# Patient Record
Sex: Female | Born: 1950 | Race: White | Hispanic: No | Marital: Married | Smoking: Never smoker
Health system: Southern US, Community
[De-identification: ages and names within clinical notes are randomized; demographics above are authoritative.]

## PROBLEM LIST (undated history)

## (undated) DIAGNOSIS — N189 Chronic kidney disease, unspecified: Secondary | ICD-10-CM

## (undated) DIAGNOSIS — M797 Fibromyalgia: Secondary | ICD-10-CM

## (undated) DIAGNOSIS — G43909 Migraine, unspecified, not intractable, without status migrainosus: Secondary | ICD-10-CM

## (undated) DIAGNOSIS — M1990 Unspecified osteoarthritis, unspecified site: Secondary | ICD-10-CM

## (undated) HISTORY — DX: Chronic kidney disease, unspecified: N18.9

## (undated) HISTORY — PX: TUBAL LIGATION: SHX77

## (undated) HISTORY — DX: Unspecified osteoarthritis, unspecified site: M19.90

## (undated) HISTORY — DX: Fibromyalgia: M79.7

## (undated) HISTORY — PX: CHOLECYSTECTOMY: SHX55

## (undated) HISTORY — PX: ABDOMINAL HYSTERECTOMY: SHX81

## (undated) HISTORY — PX: APPENDECTOMY: SHX54

## (undated) HISTORY — DX: Migraine, unspecified, not intractable, without status migrainosus: G43.909

---

## 2006-10-28 ENCOUNTER — Other Ambulatory Visit: Payer: Self-pay

## 2006-10-28 ENCOUNTER — Emergency Department: Payer: Self-pay | Admitting: Unknown Physician Specialty

## 2007-01-23 ENCOUNTER — Emergency Department: Payer: Self-pay | Admitting: Unknown Physician Specialty

## 2007-02-01 ENCOUNTER — Ambulatory Visit: Payer: Self-pay | Admitting: Ophthalmology

## 2007-02-09 ENCOUNTER — Ambulatory Visit: Payer: Self-pay | Admitting: Ophthalmology

## 2007-04-06 ENCOUNTER — Ambulatory Visit: Payer: Self-pay | Admitting: Specialist

## 2007-12-10 ENCOUNTER — Ambulatory Visit: Payer: Self-pay

## 2008-02-22 ENCOUNTER — Ambulatory Visit: Payer: Self-pay | Admitting: Family Medicine

## 2011-06-24 ENCOUNTER — Ambulatory Visit: Payer: Self-pay | Admitting: Internal Medicine

## 2012-12-30 ENCOUNTER — Ambulatory Visit: Payer: Self-pay

## 2014-03-01 ENCOUNTER — Ambulatory Visit: Payer: Self-pay | Admitting: Family Medicine

## 2014-11-21 ENCOUNTER — Ambulatory Visit
Admission: RE | Admit: 2014-11-21 | Discharge: 2014-11-21 | Disposition: A | Discharge status: Home / Self Care | Source: Ambulatory Visit | Attending: Ophthalmology | Admitting: Ophthalmology

## 2014-11-21 ENCOUNTER — Other Ambulatory Visit: Payer: Self-pay | Admitting: Ophthalmology

## 2014-11-21 DIAGNOSIS — S0441XA Injury of abducent nerve, right side, initial encounter: Secondary | ICD-10-CM

## 2014-11-21 DIAGNOSIS — H5711 Ocular pain, right eye: Secondary | ICD-10-CM | POA: Diagnosis not present

## 2014-11-21 DIAGNOSIS — J3489 Other specified disorders of nose and nasal sinuses: Secondary | ICD-10-CM | POA: Insufficient documentation

## 2014-11-21 DIAGNOSIS — G319 Degenerative disease of nervous system, unspecified: Secondary | ICD-10-CM | POA: Diagnosis not present

## 2014-11-21 DIAGNOSIS — H532 Diplopia: Secondary | ICD-10-CM | POA: Insufficient documentation

## 2014-11-21 DIAGNOSIS — H4921 Sixth [abducent] nerve palsy, right eye: Secondary | ICD-10-CM

## 2014-11-21 MED ORDER — GADOBENATE DIMEGLUMINE 529 MG/ML IV SOLN
20.0000 mL | Freq: Once | INTRAVENOUS | Status: AC | PRN
  Administered 2014-11-21: 17 mL via INTRAVENOUS

## 2015-03-12 ENCOUNTER — Other Ambulatory Visit: Payer: Self-pay | Admitting: Family Medicine

## 2015-03-12 DIAGNOSIS — R590 Localized enlarged lymph nodes: Secondary | ICD-10-CM

## 2015-03-20 ENCOUNTER — Ambulatory Visit: Admission: RE | Admit: 2015-03-20 | Payer: Medicare Other | Source: Ambulatory Visit

## 2015-05-20 DIAGNOSIS — N189 Chronic kidney disease, unspecified: Secondary | ICD-10-CM

## 2015-05-20 HISTORY — DX: Chronic kidney disease, unspecified: N18.9

## 2016-03-14 ENCOUNTER — Other Ambulatory Visit: Payer: Self-pay | Admitting: Family Medicine

## 2016-03-14 DIAGNOSIS — R7989 Other specified abnormal findings of blood chemistry: Secondary | ICD-10-CM

## 2016-03-14 DIAGNOSIS — R1011 Right upper quadrant pain: Secondary | ICD-10-CM

## 2016-03-14 DIAGNOSIS — R945 Abnormal results of liver function studies: Secondary | ICD-10-CM

## 2016-03-31 ENCOUNTER — Ambulatory Visit: Payer: TRICARE For Life (TFL)

## 2017-07-22 ENCOUNTER — Other Ambulatory Visit: Payer: Self-pay | Admitting: Family Medicine

## 2017-07-22 DIAGNOSIS — Z1239 Encounter for other screening for malignant neoplasm of breast: Secondary | ICD-10-CM

## 2017-07-22 DIAGNOSIS — Z780 Asymptomatic menopausal state: Secondary | ICD-10-CM

## 2018-04-12 ENCOUNTER — Inpatient Hospital Stay: Payer: Medicare Other

## 2018-04-12 ENCOUNTER — Ambulatory Visit
Admission: RE | Admit: 2018-04-12 | Discharge: 2018-04-12 | Disposition: A | Discharge status: Home / Self Care | Payer: Medicare Other | Source: Ambulatory Visit | Attending: Oncology | Admitting: Oncology

## 2018-04-12 ENCOUNTER — Encounter (INDEPENDENT_AMBULATORY_CARE_PROVIDER_SITE_OTHER): Payer: Self-pay

## 2018-04-12 ENCOUNTER — Other Ambulatory Visit: Payer: Self-pay

## 2018-04-12 ENCOUNTER — Inpatient Hospital Stay: Payer: Medicare Other | Attending: Oncology | Admitting: Oncology

## 2018-04-12 ENCOUNTER — Encounter: Payer: Self-pay | Admitting: Oncology

## 2018-04-12 VITALS — BP 154/84 | HR 78 | Temp 96.4°F | Resp 18 | Ht 63.5 in | Wt 209.9 lb

## 2018-04-12 DIAGNOSIS — D751 Secondary polycythemia: Secondary | ICD-10-CM

## 2018-04-12 DIAGNOSIS — Z79890 Hormone replacement therapy (postmenopausal): Secondary | ICD-10-CM | POA: Diagnosis not present

## 2018-04-12 DIAGNOSIS — Z791 Long term (current) use of non-steroidal anti-inflammatories (NSAID): Secondary | ICD-10-CM | POA: Diagnosis not present

## 2018-04-12 DIAGNOSIS — Z8249 Family history of ischemic heart disease and other diseases of the circulatory system: Secondary | ICD-10-CM | POA: Insufficient documentation

## 2018-04-12 DIAGNOSIS — Z8042 Family history of malignant neoplasm of prostate: Secondary | ICD-10-CM | POA: Insufficient documentation

## 2018-04-12 DIAGNOSIS — Z79899 Other long term (current) drug therapy: Secondary | ICD-10-CM

## 2018-04-12 LAB — URINALYSIS, COMPLETE (UACMP) WITH MICROSCOPIC
BILIRUBIN URINE: NEGATIVE
Glucose, UA: NEGATIVE mg/dL
Ketones, ur: NEGATIVE mg/dL
Leukocytes, UA: NEGATIVE
Nitrite: NEGATIVE
PROTEIN: NEGATIVE mg/dL
pH: 5.5 (ref 5.0–8.0)

## 2018-04-12 LAB — CBC WITH DIFFERENTIAL/PLATELET
Abs Immature Granulocytes: 0.01 10*3/uL (ref 0.00–0.07)
Basophils Absolute: 0.1 10*3/uL (ref 0.0–0.1)
Basophils Relative: 1 %
EOS PCT: 3 %
Eosinophils Absolute: 0.2 10*3/uL (ref 0.0–0.5)
HCT: 51.9 % — ABNORMAL HIGH (ref 36.0–46.0)
Hemoglobin: 17.5 g/dL — ABNORMAL HIGH (ref 12.0–15.0)
Immature Granulocytes: 0 %
Lymphocytes Relative: 39 %
Lymphs Abs: 2.7 10*3/uL (ref 0.7–4.0)
MCH: 29.4 pg (ref 26.0–34.0)
MCHC: 33.7 g/dL (ref 30.0–36.0)
MCV: 87.1 fL (ref 80.0–100.0)
MONO ABS: 0.5 10*3/uL (ref 0.1–1.0)
MONOS PCT: 8 %
Neutro Abs: 3.4 10*3/uL (ref 1.7–7.7)
Neutrophils Relative %: 49 %
PLATELETS: 232 10*3/uL (ref 150–400)
RBC: 5.96 MIL/uL — ABNORMAL HIGH (ref 3.87–5.11)
RDW: 13.3 % (ref 11.5–15.5)
WBC: 6.9 10*3/uL (ref 4.0–10.5)
nRBC: 0 % (ref 0.0–0.2)

## 2018-04-13 LAB — ERYTHROPOIETIN: ERYTHROPOIETIN: 5 m[IU]/mL (ref 2.6–18.5)

## 2018-04-19 ENCOUNTER — Encounter: Payer: Self-pay | Admitting: Oncology

## 2018-04-19 LAB — JAK2 GENOTYPR

## 2018-04-19 NOTE — Progress Notes (Signed)
Hematology/Oncology Consult note [facility] [phone] [phone]  Patient Care Team: Clarisse Gouge, MD as PCP - General (Family Medicine)   Name of the patient: Kara Morgan  [id number]  31-Oct-1950    Reason for referral- polycythemia   Referring physician- Dr. Vickki Muff  Date of visit: 04/19/18   History of presenting illness-patient is a 67 year old female who has been referred to [place] for polycythemia.  Her recent CBC from 03/18/2018 showed white count of 6.9, H&H of 17.8/52.7 and a platelet count of 209.  Looking back at her prior CBCs her hemoglobin was around 16 between 2015 and 2017 and has gradually moved up to 17 since 2019.She has been a lifetime never smoker.  She was taking hormone replacement therapy which included estrogen and testosterone she stopped taking it about 1 month ago.  Does not have any known chronic lung disease.  Denies any blood in her urine, fatigue or unintentional weight loss.  She does tend to wake up 2-3 times a day but feels that she gets a good night sleep.   ECOG PS- 1  Pain scale- 0   Review of systems- Review of Systems  Constitutional: Positive for malaise/fatigue. Negative for chills, fever and weight loss.  HENT: Negative for congestion, ear discharge and nosebleeds.   Eyes: Negative for blurred vision.  Respiratory: Negative for cough, hemoptysis, sputum production, shortness of breath and wheezing.   Cardiovascular: Negative for chest pain, palpitations, orthopnea and claudication.  Gastrointestinal: Negative for abdominal pain, blood in stool, constipation, diarrhea, heartburn, melena, nausea and vomiting.  Genitourinary: Negative for dysuria, flank pain, frequency, hematuria and urgency.  Musculoskeletal: Negative for back pain, joint pain and myalgias.  Skin: Negative for rash.  Neurological: Negative for dizziness, tingling, focal weakness, seizures, weakness and headaches.    Endo/Heme/Allergies: Does not bruise/bleed easily.  Psychiatric/Behavioral: Negative for depression and suicidal ideas. The patient does not have insomnia.     Allergies  Allergen Reactions  . Codeine     Severe Headache to Migraine  . Demerol [Meperidine Hcl]     Severe Headache to Migraine  . Morphine And Related     Severe Headache to Migraine  . Tramadol     Severe Headache to Migraine    There are no active problems to display for this patient.    Past Medical History:  Diagnosis Date  . Arthritis   . Chronic kidney disease 2017   Kidney Stones  . Fibromyalgia   . Migraines      Past Surgical History:  Procedure Laterality Date  . ABDOMINAL HYSTERECTOMY    . APPENDECTOMY    . CHOLECYSTECTOMY    . TUBAL LIGATION      Social History   Socioeconomic History  . Marital status: Married    Spouse name: Keenan Bachelor  . Number of children: 4  . Years of education: 2  . Highest education level: Professional school degree (e.g., MD, DDS, DVM, JD)  Occupational History  . Not on file  Social Needs  . Financial resource strain: Not on file  . Food insecurity:    Worry: Not on file    Inability: Not on file  . Transportation needs:    Medical: Not on file    Non-medical: Not on file  Tobacco Use  . Smoking status: Never Smoker  . Smokeless tobacco: Never Used  Substance and Sexual Activity  . Alcohol use: Never    Frequency: Never  . Drug use: Never  .  Sexual activity: Yes  Lifestyle  . Physical activity:    Days per week: Not on file    Minutes per session: Not on file  . Stress: Not on file  Relationships  . Social connections:    Talks on phone: Not on file    Gets together: Not on file    Attends religious service: Not on file    Active member of club or organization: Not on file    Attends meetings of clubs or organizations: Not on file    Relationship status: Not on file  . Intimate partner violence:    Fear of current or ex partner: Not on file     Emotionally abused: Not on file    Physically abused: Not on file    Forced sexual activity: Not on file  Other Topics Concern  . Not on file  Social History Narrative  . Not on file     Family History  Problem Relation Age of Onset  . Hypertension Mother   . Stroke Mother   . Skin cancer Father   . Prostate cancer Father   . Multiple myeloma Sister   . Non-Hodgkin's lymphoma Sister   . Diabetes Maternal Uncle   . Heart disease Paternal Uncle   . Heart attack Maternal Grandmother   . Heart disease Paternal Uncle   . Melanoma Paternal Uncle      Current Outpatient Medications:  .  ALPRAZolam (XANAX) 0.5 MG tablet, Take 0.5 mg by mouth daily as needed., Disp: , Rfl:  .  butalbital-acetaminophen-caffeine (FIORICET WITH CODEINE) 50-325-40-30 MG capsule, Take 1 capsule by mouth daily as needed., Disp: , Rfl:  .  celecoxib (CELEBREX) 200 MG capsule, Take 200 mg by mouth daily., Disp: , Rfl:  .  DULoxetine (CYMBALTA) 60 MG capsule, Take 60 mg by mouth daily., Disp: , Rfl:  .  estrogen-methylTESTOSTERone 0.625-1.25 MG tablet, Take 1 tablet by mouth daily., Disp: , Rfl:  .  ibuprofen (ADVIL,MOTRIN) 200 MG tablet, Take 400 mg by mouth every 6 (six) hours as needed., Disp: , Rfl:  .  omeprazole (PRILOSEC) 20 MG capsule, Take 20 mg by mouth 2 (two) times daily. Patient takes once a day., Disp: , Rfl:  .  traZODone (DESYREL) 50 MG tablet, Take 50 mg by mouth at bedtime., Disp: , Rfl:    Physical exam:  Vitals:   04/12/18 1353  BP: (!) 154/84  Pulse: 78  Resp: 18  Temp: (!) 96.4 F (35.8 C)  TempSrc: Tympanic  SpO2: 96%  Weight: 209 lb 14.1 oz (95.2 kg)  Height: 5' 3.5" (1.613 m)   Physical Exam  Constitutional: She is oriented to person, place, and time. She appears well-developed and well-nourished.  HENT:  Head: Normocephalic and atraumatic.  Eyes: Pupils are equal, round, and reactive to light. EOM are normal.  Neck: Normal range of motion.  Cardiovascular: Normal  rate, regular rhythm and normal heart sounds.  Pulmonary/Chest: Effort normal and breath sounds normal.  Abdominal: Soft. Bowel sounds are normal.  No palpable splenomegaly  Neurological: She is alert and oriented to person, place, and time.  Skin: Skin is warm and dry.      No flowsheet data found. CBC Latest Ref Rng & Units 04/12/2018  WBC 4.0 - 10.5 K/uL 6.9  Hemoglobin 12.0 - 15.0 g/dL 17.5(H)  Hematocrit 36.0 - 46.0 % 51.9(H)  Platelets 150 - 400 K/uL 232    No images are attached to the encounter.  Dg Chest 2 View  Result Date: 04/13/2018 CLINICAL DATA:  Polycythemia EXAM: CHEST - 2 VIEW COMPARISON:  None. FINDINGS: Cardiac shadows within normal limits. The lungs are well aerated bilaterally. No focal infiltrate or sizable effusion is seen. Degenerative changes of the thoracic spine are noted. IMPRESSION: No active cardiopulmonary disease. Electronically Signed   By: Inez Catalina M.D.   On: 04/13/2018 08:08    Assessment and plan- Patient is a 67 y.o. female referred for polycythemia  Based on her history it is likely that her polycythemia is secondary due to testosterone use in her hormone replacement therapy. OSA is another possibility. Discussed primary versus secondary polycythemia. causea and treatment. I will start off with cbc with diff, cmp, urinalysis, epo, JAK2 mutation testing and CXR. I will see her back in 2 weeks to discuss the results of her bloodwork and further management   Thank you for this kind referral and the opportunity to participate in the care of this patient   Visit Diagnosis 1. Polycythemia     Dr. Randa Evens, MD, MPH [facility] [phone] 04/19/2018  8:31 AM

## 2018-04-26 ENCOUNTER — Encounter: Payer: Self-pay | Admitting: Oncology

## 2018-04-26 ENCOUNTER — Inpatient Hospital Stay: Payer: Medicare Other | Attending: Oncology | Admitting: Oncology

## 2018-04-26 ENCOUNTER — Inpatient Hospital Stay: Payer: Medicare Other

## 2018-04-26 VITALS — BP 135/86 | HR 69 | Resp 16

## 2018-04-26 DIAGNOSIS — Z791 Long term (current) use of non-steroidal anti-inflammatories (NSAID): Secondary | ICD-10-CM | POA: Insufficient documentation

## 2018-04-26 DIAGNOSIS — D751 Secondary polycythemia: Secondary | ICD-10-CM | POA: Diagnosis present

## 2018-04-26 DIAGNOSIS — Z79890 Hormone replacement therapy (postmenopausal): Secondary | ICD-10-CM | POA: Diagnosis not present

## 2018-04-26 DIAGNOSIS — Z79899 Other long term (current) drug therapy: Secondary | ICD-10-CM

## 2018-04-26 DIAGNOSIS — R5383 Other fatigue: Secondary | ICD-10-CM | POA: Insufficient documentation

## 2018-04-26 NOTE — Patient Instructions (Signed)

## 2018-04-29 NOTE — Progress Notes (Signed)
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  Her recent CBC from 03/18/2018 showed white count of 6.9, H&H of 17.8/52.7 and a platelet count of 209.  Looking back at her prior CBCs her hemoglobin was around 16 between 2015 and 2017 and has gradually moved up to 17 since 2019.She has been a lifetime never smoker.  She was taking hormone replacement therapy which included estrogen and testosterone she stopped taking it about 1 month ago.  Does not have any known chronic lung disease.  Denies any blood in her urine, fatigue or unintentional weight loss.  She does tend to wake up 2-3 times a day but feels that she gets a good night sleep.   Results of polycythemia work-up were as follows: CBC showed white count of 6.9, H&H of 17.5/51.9 and a platelet count of 232.  EPO levels were normal at 5.  Jak 2 mutation testing was negative.  Urinalysis did not reveal any diarrhea.  Chest x-ray revealed no acute active cardiopulmonary disease.  Interval history-patient continues to feel significant fatigue.  Still taking her Estratest as hormone replacement therapy and is yet to receive HRT which does not have testosterone.  ECOG PS- 1 Pain scale- 0 Opioid associated constipation- no  Review of systems- Review of Systems  Constitutional: Positive for malaise/fatigue. Negative for chills, fever and weight loss.  HENT: Negative for congestion, ear discharge and nosebleeds.   Eyes: Negative for blurred vision.  Respiratory: Negative for cough, hemoptysis,  sputum production, shortness of breath and wheezing.   Cardiovascular: Negative for chest pain, palpitations, orthopnea and claudication.  Gastrointestinal: Negative for abdominal pain, blood in stool, constipation, diarrhea, heartburn, melena, nausea and vomiting.  Genitourinary: Negative for dysuria, flank pain, frequency, hematuria and urgency.  Musculoskeletal: Negative for back pain, joint pain and myalgias.  Skin: Negative for rash.  Neurological: Negative for dizziness, tingling, focal weakness, seizures, weakness and headaches.  Endo/Heme/Allergies: Does not bruise/bleed easily.  Psychiatric/Behavioral: Negative for depression and suicidal ideas. The patient does not have insomnia.      Allergies  Allergen Reactions  . Codeine     Severe Headache to Migraine  . Demerol [Meperidine Hcl]     Severe Headache to Migraine  . Morphine And Related     Severe Headache to Migraine  . Tramadol     Severe Headache to Migraine     Past Medical History:  Diagnosis Date  . Arthritis   . Chronic kidney disease 2017   Kidney Stones  . Fibromyalgia   . Migraines      Past Surgical History:  Procedure Laterality Date  . ABDOMINAL HYSTERECTOMY    . APPENDECTOMY    . CHOLECYSTECTOMY    . TUBAL LIGATION      Social History   Socioeconomic History  . Marital status: Married    Spouse name: Keenan Bachelor  . Number of children: 4  . Years of education: 2  . Highest education level: Professional school degree (e.g., MD, DDS, DVM, JD)  Occupational History  . Not on file  Social Needs  . Financial  resource strain: Not on file  . Food insecurity:    Worry: Not on file    Inability: Not on file  . Transportation needs:    Medical: Not on file    Non-medical: Not on file  Tobacco Use  . Smoking status: Never Smoker  . Smokeless tobacco: Never Used  Substance and Sexual Activity  . Alcohol use: Never    Frequency: Never  . Drug use: Never  . Sexual activity: Yes  Lifestyle  .  Physical activity:    Days per week: Not on file    Minutes per session: Not on file  . Stress: Not on file  Relationships  . Social connections:    Talks on phone: Not on file    Gets together: Not on file    Attends religious service: Not on file    Active member of club or organization: Not on file    Attends meetings of clubs or organizations: Not on file    Relationship status: Not on file  . Intimate partner violence:    Fear of current or ex partner: Not on file    Emotionally abused: Not on file    Physically abused: Not on file    Forced sexual activity: Not on file  Other Topics Concern  . Not on file  Social History Narrative  . Not on file    Family History  Problem Relation Age of Onset  . Hypertension Mother   . Stroke Mother   . Skin cancer Father   . Prostate cancer Father   . Multiple myeloma Sister   . Non-Hodgkin's lymphoma Sister   . Diabetes Maternal Uncle   . Heart disease Paternal Uncle   . Heart attack Maternal Grandmother   . Heart disease Paternal Uncle   . Melanoma Paternal Uncle      Current Outpatient Medications:  .  ALPRAZolam (XANAX) 0.5 MG tablet, Take 0.5 mg by mouth daily as needed., Disp: , Rfl:  .  butalbital-acetaminophen-caffeine (FIORICET WITH CODEINE) 50-325-40-30 MG capsule, Take 1 capsule by mouth daily as needed., Disp: , Rfl:  .  celecoxib (CELEBREX) 200 MG capsule, Take 200 mg by mouth daily., Disp: , Rfl:  .  DULoxetine (CYMBALTA) 60 MG capsule, Take 60 mg by mouth daily., Disp: , Rfl:  .  estrogen-methylTESTOSTERone 0.625-1.25 MG tablet, Take 1 tablet by mouth daily., Disp: , Rfl:  .  ibuprofen (ADVIL,MOTRIN) 200 MG tablet, Take 400 mg by mouth every 6 (six) hours as needed., Disp: , Rfl:  .  omeprazole (PRILOSEC) 20 MG capsule, Take 20 mg by mouth 2 (two) times daily. Patient takes once a day., Disp: , Rfl:  .  traZODone (DESYREL) 50 MG tablet, Take 50 mg by mouth at bedtime., Disp: , Rfl:  .  metoprolol tartrate  (LOPRESSOR) 25 MG tablet, Take 12.5 mg by mouth 2 (two) times daily. , Disp: , Rfl:   Physical exam:  Vitals:   04/26/18 1332  BP: 134/81  Pulse: 67  Resp: 16  Temp: (!) 96.8 F (36 C)  TempSrc: Tympanic  Weight: 212 lb 8.4 oz (96.4 kg)  Height: 5' 3.5" (1.613 m)   Physical Exam Constitutional:      General: She is not in acute distress. HENT:     Head: Normocephalic and atraumatic.  Eyes:     Pupils: Pupils are equal, round, and reactive to light.  Neck:     Musculoskeletal: Normal range of motion.  Cardiovascular:  Rate and Rhythm: Normal rate and regular rhythm.     Heart sounds: Normal heart sounds.  Pulmonary:     Effort: Pulmonary effort is normal.     Breath sounds: Normal breath sounds.  Abdominal:     General: Bowel sounds are normal.     Palpations: Abdomen is soft.  Skin:    General: Skin is warm and dry.  Neurological:     Mental Status: She is alert and oriented to person, place, and time.      No flowsheet data found. CBC Latest Ref Rng & Units 04/12/2018  WBC 4.0 - 10.5 K/uL 6.9  Hemoglobin 12.0 - 15.0 g/dL 17.5(H)  Hematocrit 36.0 - 46.0 % 51.9(H)  Platelets 150 - 400 K/uL 232    No images are attached to the encounter.  Dg Chest 2 View  Result Date: 04/13/2018 CLINICAL DATA:  Polycythemia EXAM: CHEST - 2 VIEW COMPARISON:  None. FINDINGS: Cardiac shadows within normal limits. The lungs are well aerated bilaterally. No focal infiltrate or sizable effusion is seen. Degenerative changes of the thoracic spine are noted. IMPRESSION: No active cardiopulmonary disease. Electronically Signed   By: Inez Catalina M.D.   On: 04/13/2018 08:08     Assessment and plan- Patient is a 67 y.o. 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  I again With the patient to speak to her PCP  or GYN to switch her HRT product to something which has only estrogen minus the testosterone.  Patient reports significant fatigue and would like a trial of phlebotomy to see if it would improve her symptoms.  I will therefore proceed with phlebotomy today with a goal hematocrit of less than 50 every 2 weeks.  Patient will get CBC at 2,4 6  And in 8 weeks and get phlebotomy if her hematocrit is more than 50.  She will see Dr. Mike Gip at 8 weeks with CBC   Visit Diagnosis 1. Polycythemia, secondary      Dr. Randa Evens, MD, MPH [facility] [phone] 04/29/2018 3:28 PM

## 2018-05-10 ENCOUNTER — Inpatient Hospital Stay: Payer: Medicare Other

## 2018-05-10 VITALS — BP 134/78 | HR 71 | Temp 97.0°F | Resp 16

## 2018-05-10 DIAGNOSIS — D751 Secondary polycythemia: Secondary | ICD-10-CM | POA: Diagnosis not present

## 2018-05-10 LAB — CBC
HCT: 51.9 % — ABNORMAL HIGH (ref 36.0–46.0)
Hemoglobin: 17.5 g/dL — ABNORMAL HIGH (ref 12.0–15.0)
MCH: 29.3 pg (ref 26.0–34.0)
MCHC: 33.7 g/dL (ref 30.0–36.0)
MCV: 86.9 fL (ref 80.0–100.0)
NRBC: 0 % (ref 0.0–0.2)
Platelets: 236 10*3/uL (ref 150–400)
RBC: 5.97 MIL/uL — AB (ref 3.87–5.11)
RDW: 13.3 % (ref 11.5–15.5)
WBC: 7.6 10*3/uL (ref 4.0–10.5)

## 2018-05-24 ENCOUNTER — Inpatient Hospital Stay: Payer: Medicare Other

## 2018-05-24 ENCOUNTER — Inpatient Hospital Stay: Payer: Medicare Other | Attending: Hematology and Oncology

## 2018-05-24 DIAGNOSIS — D751 Secondary polycythemia: Secondary | ICD-10-CM

## 2018-05-24 LAB — CBC
HCT: 46.9 % — ABNORMAL HIGH (ref 36.0–46.0)
Hemoglobin: 16.2 g/dL — ABNORMAL HIGH (ref 12.0–15.0)
MCH: 29.9 pg (ref 26.0–34.0)
MCHC: 34.5 g/dL (ref 30.0–36.0)
MCV: 86.7 fL (ref 80.0–100.0)
Platelets: 222 10*3/uL (ref 150–400)
RBC: 5.41 MIL/uL — ABNORMAL HIGH (ref 3.87–5.11)
RDW: 13.2 % (ref 11.5–15.5)
WBC: 7.4 10*3/uL (ref 4.0–10.5)
nRBC: 0 % (ref 0.0–0.2)

## 2018-06-07 ENCOUNTER — Inpatient Hospital Stay: Payer: Medicare Other

## 2018-06-16 ENCOUNTER — Other Ambulatory Visit: Payer: Self-pay

## 2018-06-16 DIAGNOSIS — D751 Secondary polycythemia: Secondary | ICD-10-CM

## 2018-06-20 ENCOUNTER — Other Ambulatory Visit: Payer: Self-pay | Admitting: Hematology and Oncology

## 2018-06-20 DIAGNOSIS — D751 Secondary polycythemia: Secondary | ICD-10-CM | POA: Insufficient documentation

## 2018-06-20 NOTE — Progress Notes (Deleted)
[facility] day:  06/20/2018  Chief Complaint: Kara Morgan is a 68 y.o. 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  HPI: ***  xxxx  Initially seen on 04/12/2018.  patient is a 68 year old female who has been referred to [place] for polycythemia. Her recent CBC from 03/18/2018 showed white count of 6.9, H&H of 17.8/52.7 and a platelet count of 209. Looking back at her prior CBCs her hemoglobin was around 16 between 2015 and 2017 and has gradually moved up to 17 since 2019.She has been a lifetime never smoker.She was taking hormone replacement therapy which included estrogen and testosterone she stopped taking it about 1 month ago. Does not have any known chronic lung disease. Denies any blood in her urine, fatigue or unintentional weight loss. She does tend to wake up 2-3 times a day but feels that she gets a good night sleep.   Results of polycythemia work-up were as follows: CBC showed white count of 6.9, H&H of 17.5/51.9 and a platelet count of 232.  EPO levels were normal at 5 (2.6 - 8.5).  Jak 2 mutation testing was negative.  Urinalysis did not reveal any diarrhea.  Chest x-ray revealed no acute active cardiopulmonary disease.  Patient was last seen by Dr. Janese Banks on 04/26/2018.  Interval history-patient continues to feel significant fatigue.  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  I again With the patient to speak to her PCP or GYN to switch her HRT product to something which has only estrogen minus the testosterone.  Patient reports significant fatigue and would like a trial of phlebotomy to see if it would  improve her symptoms.  I will therefore proceed with phlebotomy today with a goal hematocrit of less than 50 every 2 weeks.  Patient will get CBC at 2,4 6  And in 8 weeks and get phlebotomy if her hematocrit is more than 50.  She will see Dr. Mike Gip at 8 weeks with CBC  She underwent phlebotomy x 2 (04/26/2018 and 05/10/2018).  xxxx  Symptomatically,   Past Medical History:  Diagnosis Date  . Arthritis   . Chronic kidney disease 2017   Kidney Stones  . Fibromyalgia   . Migraines     Past Surgical History:  Procedure Laterality Date  . ABDOMINAL HYSTERECTOMY    . APPENDECTOMY    . CHOLECYSTECTOMY    . TUBAL LIGATION      Family History  Problem Relation Age of Onset  . Hypertension Mother   . Stroke Mother   . Skin cancer Father   . Prostate cancer Father   . Multiple myeloma Sister   . Non-Hodgkin's lymphoma Sister   . Diabetes Maternal Uncle   . Heart disease Paternal Uncle   . Heart attack Maternal Grandmother   . Heart disease Paternal Uncle   . Melanoma Paternal Uncle     Social History:  reports that she has never smoked. She has never used smokeless tobacco. She reports that she does not drink alcohol or use drugs.  The patient is accompanied by *** alone today.  Allergies:  Allergies  Allergen Reactions  . Codeine     Severe Headache to  Migraine  . Demerol [Meperidine Hcl]     Severe Headache to Migraine  . Morphine And Related     Severe Headache to Migraine  . Tramadol     Severe Headache to Migraine    Current Medications: Current Outpatient Medications  Medication Sig Dispense Refill  . ALPRAZolam (XANAX) 0.5 MG tablet Take 0.5 mg by mouth daily as needed.    . butalbital-acetaminophen-caffeine (FIORICET WITH CODEINE) 50-325-40-30 MG capsule Take 1 capsule by mouth daily as needed.    . celecoxib (CELEBREX) 200 MG capsule Take 200 mg by mouth daily.    . DULoxetine (CYMBALTA) 60 MG capsule Take 60 mg by mouth daily.    Marland Kitchen  estrogen-methylTESTOSTERone 0.625-1.25 MG tablet Take 1 tablet by mouth daily.    Marland Kitchen ibuprofen (ADVIL,MOTRIN) 200 MG tablet Take 400 mg by mouth every 6 (six) hours as needed.    . metoprolol tartrate (LOPRESSOR) 25 MG tablet Take 12.5 mg by mouth 2 (two) times daily.     Marland Kitchen omeprazole (PRILOSEC) 20 MG capsule Take 20 mg by mouth 2 (two) times daily. Patient takes once a day.    . traZODone (DESYREL) 50 MG tablet Take 50 mg by mouth at bedtime.     No current facility-administered medications for this visit.     Review of Systems:  GENERAL:  Feels good.  Active.  No fevers, sweats or weight loss. PERFORMANCE STATUS (ECOG):  *** HEENT:  No visual changes, runny nose, sore throat, mouth sores or tenderness. Lungs: No shortness of breath or cough.  No hemoptysis. Cardiac:  No chest pain, palpitations, orthopnea, or PND. GI:  No nausea, vomiting, diarrhea, constipation, melena or hematochezia. GU:  No urgency, frequency, dysuria, or hematuria. Musculoskeletal:  No back pain.  No joint pain.  No muscle tenderness. Extremities:  No pain or swelling. Skin:  No rashes or skin changes. Neuro:  No headache, numbness or weakness, balance or coordination issues. Endocrine:  No diabetes, thyroid issues, hot flashes or night sweats. Psych:  No mood changes, depression or anxiety. Pain:  No focal pain. Review of systems:  All other systems reviewed and found to be negative.  Physical Exam: There were no vitals taken for this visit. GENERAL:  Well developed, well nourished, **man sitting comfortably in the exam room in no acute distress. MENTAL STATUS:  Alert and oriented to person, place and time. HEAD:  *** hair.  Normocephalic, atraumatic, face symmetric, no Cushingoid features. EYES:  *** eyes.  Pupils equal round and reactive to light and accomodation.  No conjunctivitis or scleral icterus. ENT:  Oropharynx clear without lesion.  Tongue normal. Mucous membranes moist.  RESPIRATORY:  Clear to  auscultation without rales, wheezes or rhonchi. CARDIOVASCULAR:  Regular rate and rhythm without murmur, rub or gallop. ABDOMEN:  Soft, non-tender, with active bowel sounds, and no hepatosplenomegaly.  No masses. SKIN:  No rashes, ulcers or lesions. EXTREMITIES: No edema, no skin discoloration or tenderness.  No palpable cords. LYMPH NODES: No palpable cervical, supraclavicular, axillary or inguinal adenopathy  NEUROLOGICAL: Unremarkable. PSYCH:  Appropriate.   No visits with results within 3 Day(s) from this visit.  Latest known visit with results is:  Appointment on 05/24/2018  Component Date Value Ref Range Status  . WBC 05/24/2018 7.4  4.0 - 10.5 K/uL Final  . RBC 05/24/2018 5.41* 3.87 - 5.11 MIL/uL Final  . Hemoglobin 05/24/2018 16.2* 12.0 - 15.0 g/dL Final  . HCT 05/24/2018 46.9* 36.0 - 46.0 % Final  . MCV 05/24/2018  86.7  80.0 - 100.0 fL Final  . MCH 05/24/2018 29.9  26.0 - 34.0 pg Final  . MCHC 05/24/2018 34.5  30.0 - 36.0 g/dL Final  . RDW 05/24/2018 13.2  11.5 - 15.5 % Final  . Platelets 05/24/2018 222  150 - 400 K/uL Final  . nRBC 05/24/2018 0.0  0.0 - 0.2 % Final   Performed at [facility], [address], [place], [place] [address]    Assessment:  SAMAYRA HEBEL is a 68 y.o. female with erythrocystosis felt secondary estratest (estrogen + testosterone) supplementation.  Work-up on 04/12/2018 revealed a hematocrit if 51.9, hemoglobin 17.5, MCV x, platelets 232,000, WBC 6900.  Epo level was 5 (2.6 - 8.5).  JAK2 V671F was negative.  Urinalysis was negative.  CXR revealed no acute active cardiopulmonary disease.  She underwent phlebotomy x 2 (04/26/2018 and 05/10/2018).  Hematocrit goal was < 50.  Symptomatically,  Plan: 1.  Labs today:  CBC with diff, ferritin, carbon monoxide level, JAK2 exon 12-15, CALR. 2.  Secondary erythrocytosis   3.   4.    Lequita Asal, MD  06/20/2018, 11:23 AM   I saw and evaluated the patient, participating in the  key portions of the service and reviewing pertinent diagnostic studies and records.  I reviewed the nurse practitioner's note and agree with the findings and the plan.  The assessment and plan were discussed with the patient.  Additional diagnostic studies of *** are needed to clarify *** and would change the clinical management.  A few ***multiple questions were asked by the patient and answered.   Nolon Stalls, MD 06/20/2018,11:23 AM

## 2018-06-21 ENCOUNTER — Ambulatory Visit: Payer: Medicare Other | Admitting: Hematology and Oncology

## 2018-06-21 ENCOUNTER — Other Ambulatory Visit: Payer: Medicare Other

## 2019-08-04 IMAGING — CR DG CHEST 2V
2 series · 3 of 3 positions shown · non-contrast
Comparison: None.

CLINICAL DATA: Polycythemia

EXAM:
CHEST - 2 VIEW

[Series 1: chest pa · 0.14mm/px · 2 of 2 slices shown]
[im 1/2]
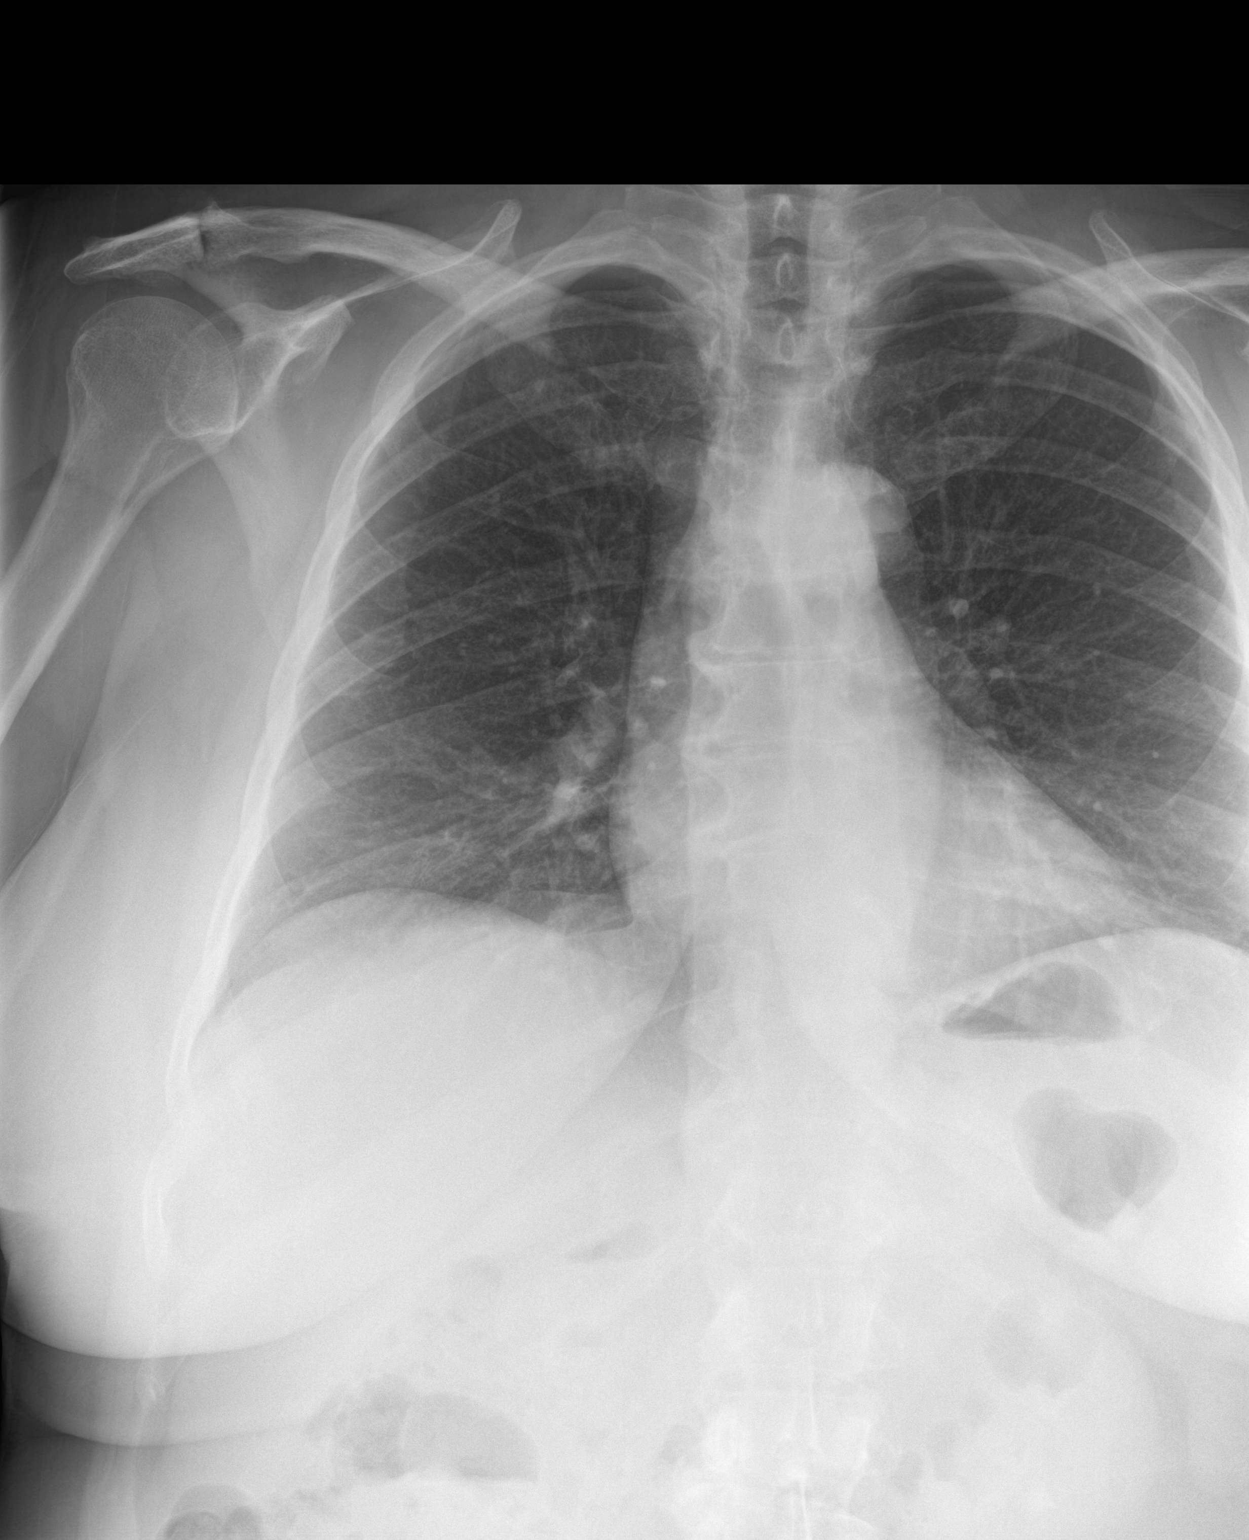
[im 2/2]
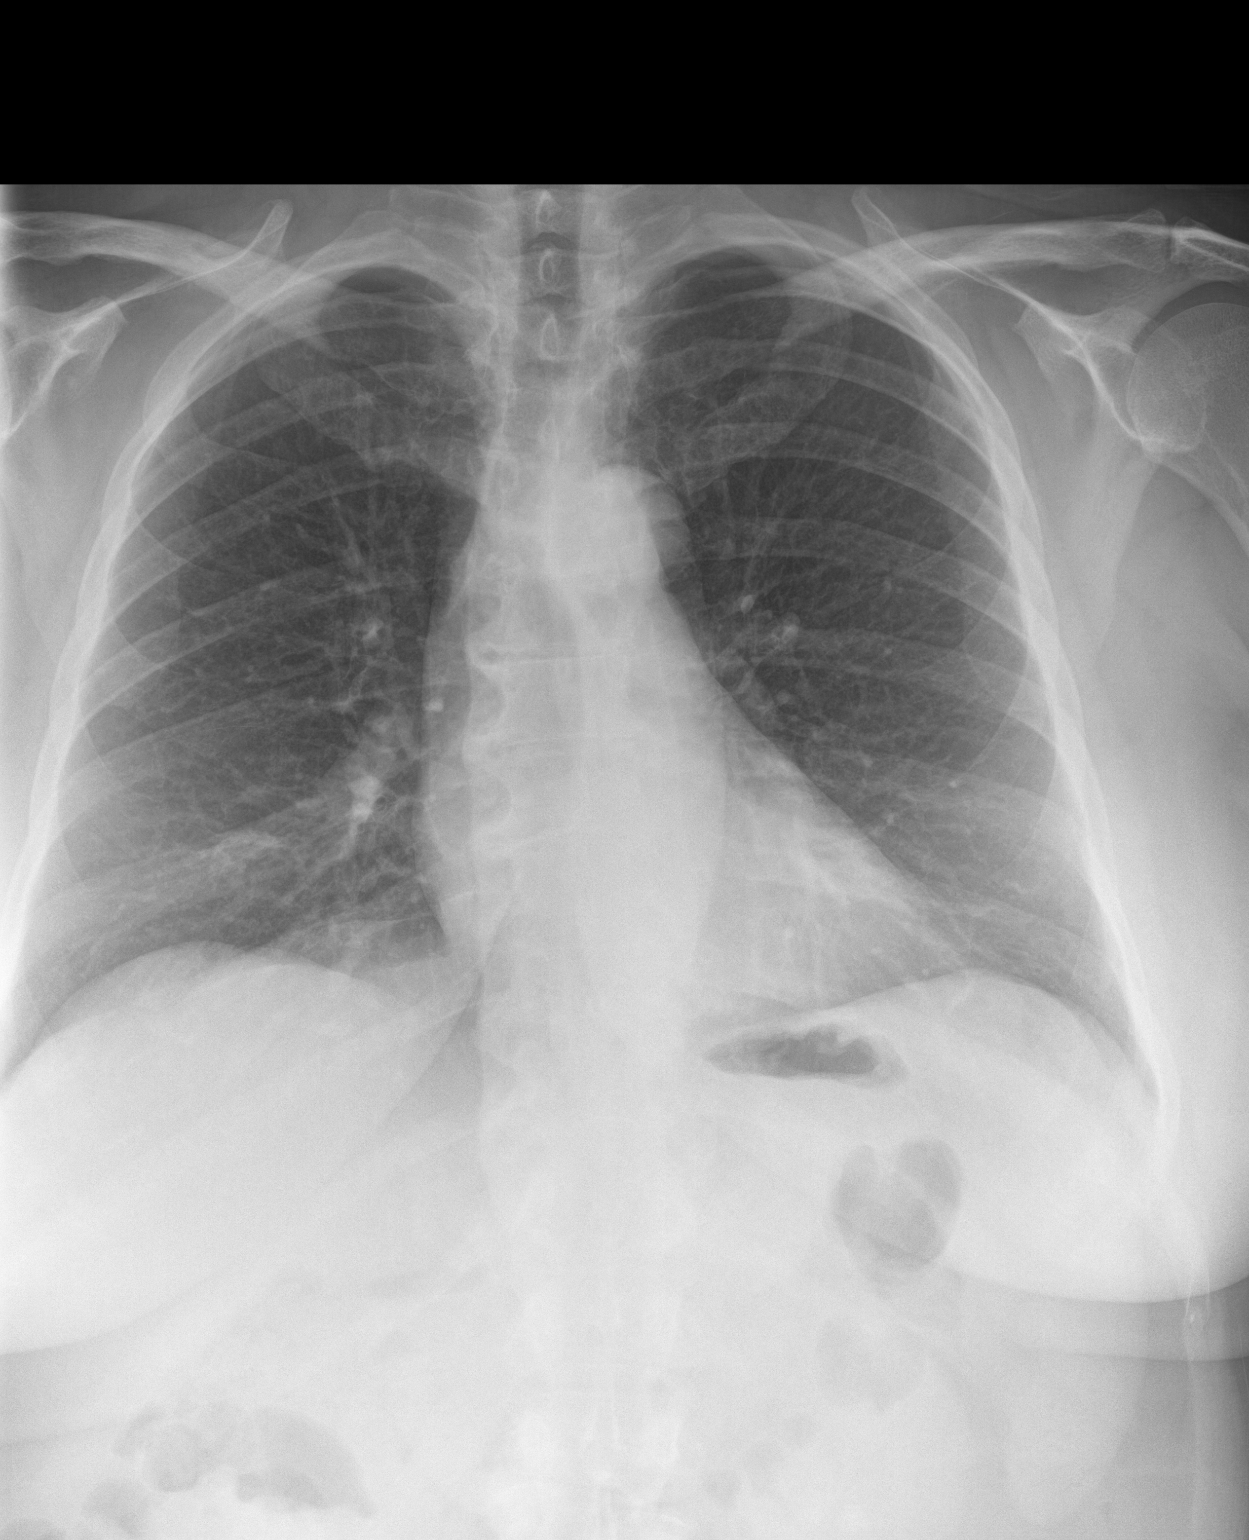

[chest lat]
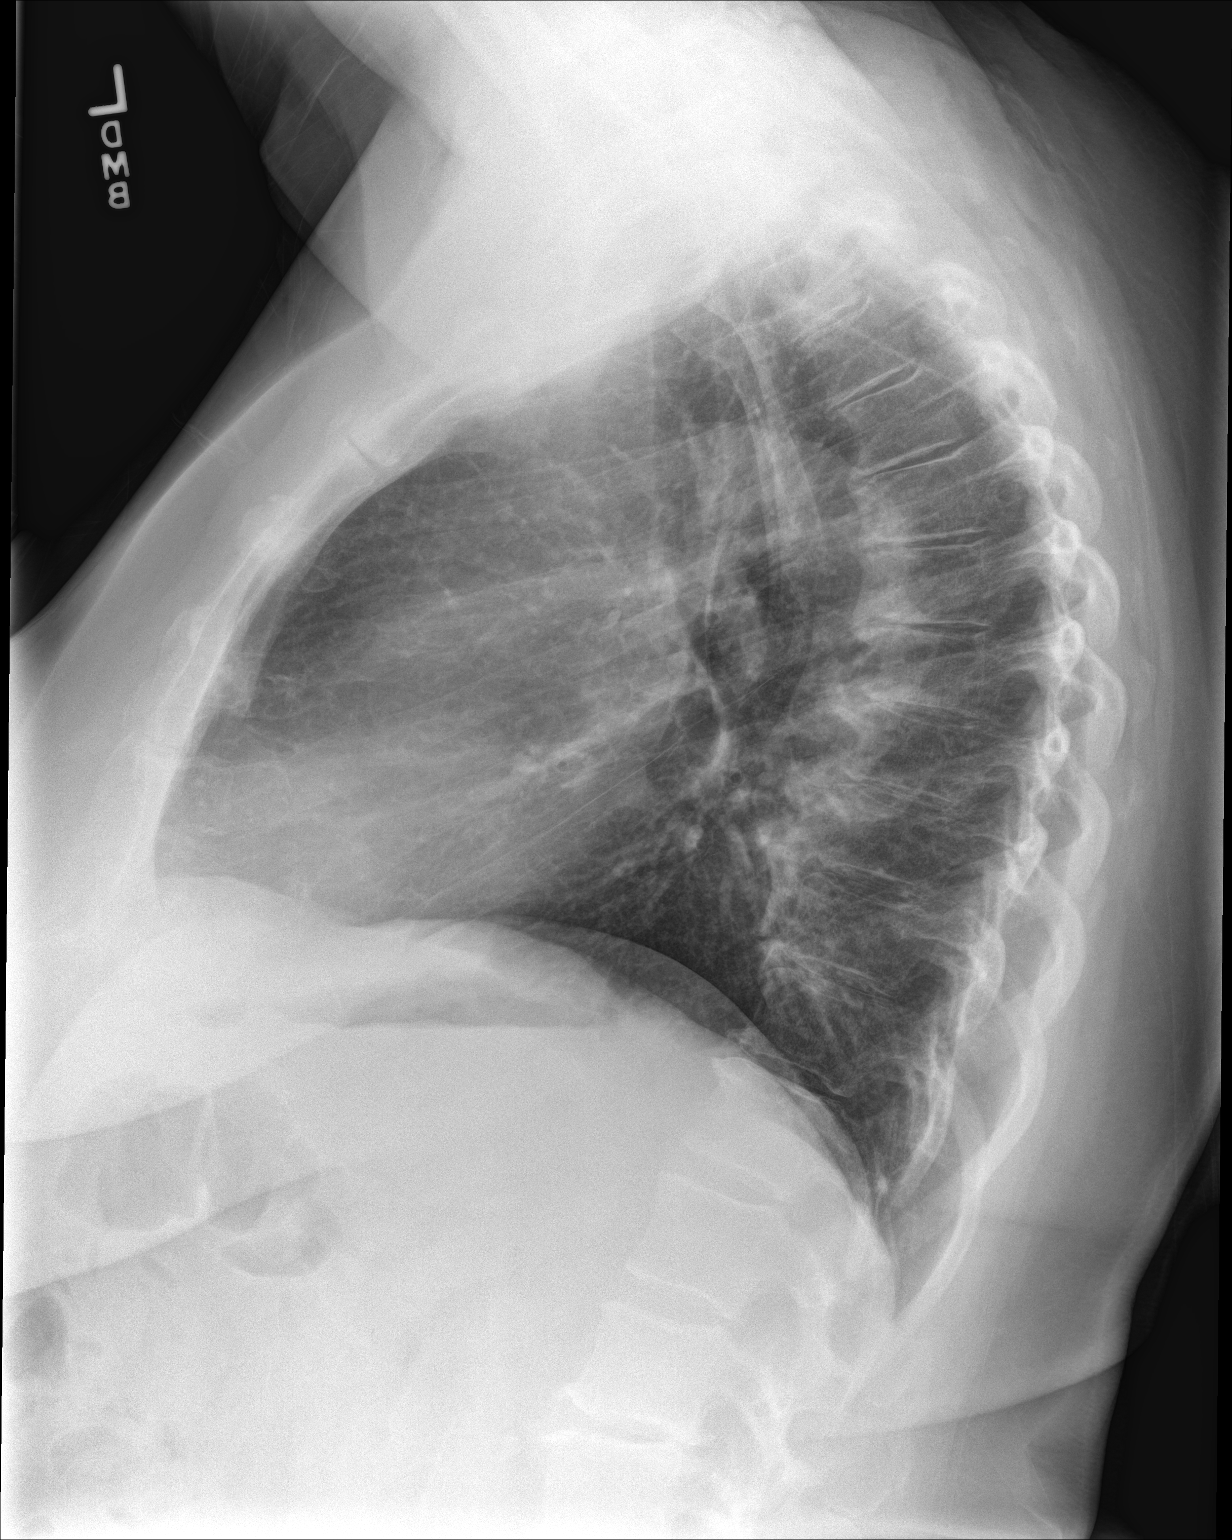

[3 of 3 positions shown; findings below may reference images not displayed]

FINDINGS: Cardiac shadows within normal limits. The lungs are well aerated
bilaterally. No focal infiltrate or sizable effusion is seen.
Degenerative changes of the thoracic spine are noted.
IMPRESSION: No active cardiopulmonary disease.
